# Patient Record
Sex: Male | Born: 2005
Health system: Southern US, Community
[De-identification: ages and names within clinical notes are randomized; demographics above are authoritative.]

## PROBLEM LIST (undated history)

## (undated) HISTORY — PX: TONSILLECTOMY: SUR1361

---
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
## 2024-05-07 ENCOUNTER — Telehealth: Payer: Self-pay

## 2024-05-07 ENCOUNTER — Other Ambulatory Visit (HOSPITAL_COMMUNITY): Payer: Self-pay

## 2024-05-07 NOTE — Telephone Encounter (Signed)
 Pharmacy Patient Advocate Encounter   Received notification from Fax that prior authorization for Fingolimod  is required/requested.   Insurance verification completed.   The patient is insured through [facility].   Per test claim: PA required; PA submitted to above mentioned insurance via Prompt PA Key/confirmation #/EOC [id number] Status is pending

## 2024-05-11 NOTE — Telephone Encounter (Signed)
 Pharmacy Patient Advocate Encounter  Received notification from [employer] that Prior Authorization for Fingolimod  has been APPROVED from 05/11/2024 to 05/10/2025   PA #/Case ID/Reference #: [id number]

## 2024-05-24 NOTE — Progress Notes (Unsigned)
 "   No chief complaint on file.   HISTORY OF PRESENT ILLNESS:  05/24/2024 ALL:  Richard Montes is a 18 y.o. male here today for follow up for RRMS. He continues fingolimod .   Gait  Sleep  Mood   Vitamin D   HISTORY (copied from Dr Duncan previous note)  Richard Montes is a 18 year old male with diagnosis of relapsing remitting multiple sclerosis.     Update 11/06/2023:: He is tolerating Gilenya  well.    He denies any new neurologic symptoms and no exacerbations since last visit.       MRI of the brain in 2024 did not show any new lesions compared to September 2023.  MRI of the brain September 2023 showed 1 focus not present in September 2022.  It is unclear if this happened during the 2 months before starting therapy.   The gait and balance are fine.   He has no trouble keeping up with friends. He has no falls.   No numbness or tingling.   Bladder function is fine.   Vision is fine.  Last year he had double vision and it completely resolved.   No acuity or color vision change.      He notes fatigue.  He exercises regularly.   He is on summer break and sleeps irregularly.   He also sometimes skips a meal.   . He occasionally takes melatonin   He denies depression or anxiety.      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     He is taking a Vit D supplement and laast level was 67 (was 17 at disease onset)   MS history: Unnamed  presented with dizziness , double vision and left tongue numbness in May 2022.  He does not recall if diplopia was worse to the left or right.   The diplopia was binocular.   Balance was mildly off.   He denied ataxia. He was referred to Dr. Corinthia of pediatric neurology.  An MRI of the brain was ordered.  It was performed 11/17/2020 and was abnormal showing multiple T2/FLAIR hyperintense foci predominantly in the hemispheres.  One focus was in the left middle  cerebellar peduncle and another in the thalamus.  The focus in the left middle cerebellar peduncle could explain his symptoms.  Due to the results, he  was admitted to [facility] for further evaluation and treatment.  He had MRI imaging of the spine that was normal.  CSF was obtained.  It showed 3 oligoclonal bands in the CSF not present in the serum and the IgG index was elevated.  He received 3 days of IV SOlumedrol in the hospital and is being titrated down currently.   Symptoms improved and he is baseline now.  He started Gilenya  late November 2022.   No FH of MS or autoimmune disorders.  His mom was evaluated for autoimmune alopecia but changes were felt to be due to a pregnancy at that time.       MRI images personally reviewed: MRI brain 05/07/2023 showed Multiple T2/FLAIR hyperintense foci in the cerebral hemispheres, left middle cerebellar peduncle and cerebellum in a pattern consistent with chronic demyelinating plaque associated with multiple sclerosis. None of the foci enhance or appear to be acute. Compared to the MRI from 02/20/2022, there were no new lesions.      MRI 02/20/2022 of the brain shows  T2/FLAIR hyperintense foci in the cerebral hemispheres and foci in the cerebellum consistent with chronic demyelinating plaque associated with multiple sclerosis.  None of the foci appear to be acute and they do not enhance.  However, one focus in the right cerebellar hemisphere seen on today's study was not present on the 02/14/2021 MRI.  Normal enhancement pattern.  No acute findings.   MRI of the brain 02/14/2021 showed 1 additional lesion in the cerebellum compared to the 11/17/2020 MRI.  There were no enhancing lesions.   MRI of the brain 11/17/2020 shows multiple T2/FLAIR hyperintense foci in the left middle cerebellar peduncle, right thalamus and in the periventricular, juxtacortical and deep white matter of both hemispheres.  None of the foci appear to be acute.  They do not  enhance.   MRI of the cervical, thoracic and lumbar spine 11/21/2020 was normal.  The spinal cord had normal signal without evidence of demyelination.  There was no significant degenerative changes noted.   CSF 11/20/2020 showed 3 oligoclonal bands in the CSF not present in the serum and an elevated IgG index.  Myelin basic protein was mildly elevated.   Other labs: HIV, CRP, ESR, TSH, CMP, CBC and anti-NMO antibody were normal or negative.  Vitamin D  was reduced at 17 (30-100 ng/mL normal)   REVIEW OF SYSTEMS: Out of a complete 14 system review of symptoms, the patient complains only of the following symptoms, and all other reviewed systems are negative.   ALLERGIES: Allergies[1]   HOME MEDICATIONS: Outpatient Medications Prior to Visit  Medication Sig Dispense Refill   Fingolimod  HCl 0.5 MG CAPS Take 1 capsule (0.5 mg total) by mouth daily. 30 capsule 11   Vitamin D , Ergocalciferol , (DRISDOL ) 1.25 MG (50000 UNIT) CAPS capsule Take 1 capsule (50,000 Units total) by mouth every 7 (seven) days. 13 capsule 3   No facility-administered medications prior to visit.     PAST MEDICAL HISTORY: No past medical history on file.   PAST SURGICAL HISTORY: Past Surgical History:  Procedure Laterality Date   TONSILLECTOMY       FAMILY HISTORY: Family History  Problem Relation Age of Onset   Hypertension Maternal Grandfather    Diabetes Paternal Grandfather    Multiple sclerosis Neg Hx      SOCIAL HISTORY: Social History   Socioeconomic History   Marital status: Single    Spouse name: Not on file   Number of children: Not on file   Years of education: Not on file   Highest education level: Not on file  Occupational History   Not on file  Tobacco Use   Smoking status: Never   Smokeless tobacco: Never  Vaping Use   Vaping status: Unknown  Substance and Sexual Activity   Alcohol use: Never   Drug use: Never   Sexual activity: Never  Other Topics Concern   Not on file   Social History Narrative   Lives with mom, dad, 2 brothers and sister.    In college at [employer]   Social Drivers of Health   Tobacco Use: Low Risk (11/06/2023)   Patient History    Smoking Tobacco Use: Never    Smokeless Tobacco Use: Never    Passive Exposure: Not on file  Financial Resource Strain: Not on file  Food Insecurity: Not on file  Transportation Needs: Not on file  Physical Activity: Not on file  Stress: Not on file  Social Connections: Not on file  Intimate Partner Violence: Not on file  Depression (PHQ2-9):  Not on file  Alcohol Screen: Not on file  Housing: Not on file  Utilities: Not on file  Health Literacy: Not on file     PHYSICAL EXAM  There were no vitals filed for this visit. There is no height or weight on file to calculate BMI.  Generalized: Well developed, in no acute distress  Cardiology: normal rate and rhythm, no murmur auscultated  Respiratory: clear to auscultation bilaterally    Neurological examination  Mentation: Alert oriented to time, place, history taking. Follows all commands speech and language fluent Cranial nerve II-XII: Pupils were equal round reactive to light. Extraocular movements were full, visual field were full on confrontational test. Facial sensation and strength were normal. Uvula tongue midline. Head turning and shoulder shrug  were normal and symmetric. Motor: The motor testing reveals 5 over 5 strength of all 4 extremities. Good symmetric motor tone is noted throughout.  Sensory: Sensory testing is intact to soft touch on all 4 extremities. No evidence of extinction is noted.  Coordination: Cerebellar testing reveals good finger-nose-finger and heel-to-shin bilaterally.  Gait and station: Gait is normal. Tandem gait is normal. Romberg is negative. No drift is seen.  Reflexes: Deep tendon reflexes are symmetric and normal bilaterally.    DIAGNOSTIC DATA (LABS, IMAGING, TESTING) - I reviewed patient records, labs, notes,  testing and imaging myself where available.  Lab Results  Component Value Date   WBC 2.7 (L) 11/06/2023   HGB 14.0 11/06/2023   HCT 47.0 11/06/2023   MCV 90 11/06/2023   PLT 235 11/06/2023      Component Value Date/Time   NA 141 04/22/2023 1035   K 4.0 04/22/2023 1035   CL 104 04/22/2023 1035   CO2 25 04/22/2023 1035   GLUCOSE 93 04/22/2023 1035   GLUCOSE 96 11/20/2020 1838   BUN 9 04/22/2023 1035   CREATININE 0.97 04/22/2023 1035   CALCIUM 9.4 04/22/2023 1035   PROT 7.0 11/06/2023 1035   ALBUMIN 4.8 11/06/2023 1035   AST 27 11/06/2023 1035   ALT 76 (H) 11/06/2023 1035   ALKPHOS 114 11/06/2023 1035   BILITOT 0.9 11/06/2023 1035   GFRNONAA NOT CALCULATED 11/20/2020 1838   No results found for: CHOL, HDL, LDLCALC, LDLDIRECT, TRIG, CHOLHDL No results found for: YHAJ8R No results found for: VITAMINB12 Lab Results  Component Value Date   TSH 1.748 11/20/2020        No data to display               No data to display           ASSESSMENT AND PLAN  18 y.o. year old male  has no past medical history on file. here with    No diagnosis found.  Remigio P Scriven ***.  Healthy lifestyle habits encouraged. *** will follow up with PCP as directed. *** will return to see me in ***, sooner if needed. *** verbalizes understanding and agreement with this plan.   No orders of the defined types were placed in this encounter.    No orders of the defined types were placed in this encounter.    Greig Forbes, MSN, FNP-C 05/24/2024, 4:24 PM  [facility] [address] [place], [place] [address] [phone]     [1] No Known Allergies  "

## 2024-05-24 NOTE — Patient Instructions (Signed)

## 2024-05-25 ENCOUNTER — Ambulatory Visit: Admitting: Family Medicine

## 2024-05-25 ENCOUNTER — Encounter: Payer: Self-pay | Admitting: Family Medicine

## 2024-05-25 VITALS — BP 111/61 | Ht 67.0 in | Wt 135.5 lb

## 2024-05-25 DIAGNOSIS — G35D Multiple sclerosis, unspecified: Secondary | ICD-10-CM

## 2024-05-25 DIAGNOSIS — E559 Vitamin D deficiency, unspecified: Secondary | ICD-10-CM

## 2024-05-25 DIAGNOSIS — Z79899 Other long term (current) drug therapy: Secondary | ICD-10-CM

## 2024-05-26 ENCOUNTER — Ambulatory Visit: Payer: Self-pay | Admitting: Neurology

## 2024-05-26 DIAGNOSIS — G35D Multiple sclerosis, unspecified: Secondary | ICD-10-CM

## 2024-05-26 LAB — CBC WITH DIFFERENTIAL/PLATELET
Basophils Absolute: 0 x10E3/uL (ref 0.0–0.2)
Basos: 0 %
EOS (ABSOLUTE): 0.2 x10E3/uL (ref 0.0–0.4)
Eos: 5 %
Hematocrit: 42.6 % (ref 37.5–51.0)
Hemoglobin: 13.4 g/dL (ref 13.0–17.7)
Immature Grans (Abs): 0 x10E3/uL (ref 0.0–0.1)
Immature Granulocytes: 0 %
Lymphocytes Absolute: 0.5 x10E3/uL — ABNORMAL LOW (ref 0.7–3.1)
Lymphs: 17 %
MCH: 27.9 pg (ref 26.6–33.0)
MCHC: 31.5 g/dL (ref 31.5–35.7)
MCV: 89 fL (ref 79–97)
Monocytes Absolute: 0.3 x10E3/uL (ref 0.1–0.9)
Monocytes: 10 %
Neutrophils Absolute: 2 x10E3/uL (ref 1.4–7.0)
Neutrophils: 68 %
Platelets: 218 x10E3/uL (ref 150–450)
RBC: 4.8 x10E6/uL (ref 4.14–5.80)
RDW: 13.2 % (ref 11.6–15.4)
WBC: 3 x10E3/uL — ABNORMAL LOW (ref 3.4–10.8)

## 2024-05-26 LAB — VITAMIN D 25 HYDROXY (VIT D DEFICIENCY, FRACTURES): Vit D, 25-Hydroxy: 28.3 ng/mL — ABNORMAL LOW (ref 30.0–100.0)

## 2024-06-04 ENCOUNTER — Other Ambulatory Visit: Payer: Self-pay | Admitting: Neurology

## 2024-06-04 DIAGNOSIS — G35D Multiple sclerosis, unspecified: Secondary | ICD-10-CM

## 2024-06-04 NOTE — Telephone Encounter (Signed)
 Last seen on 05/25/24 Follow up scheduled on 01/24/25

## 2024-06-17 ENCOUNTER — Other Ambulatory Visit: Payer: Self-pay

## 2024-06-17 DIAGNOSIS — G35D Multiple sclerosis, unspecified: Secondary | ICD-10-CM

## 2024-06-17 MED ORDER — FINGOLIMOD HCL 0.5 MG PO CAPS: 1.0000 | ORAL_CAPSULE | Freq: Every day | ORAL | 2 refills | Status: DC

## 2024-06-17 MED ORDER — FINGOLIMOD HCL 0.5 MG PO CAPS: 1.0000 | ORAL_CAPSULE | Freq: Every day | ORAL | 2 refills | Status: AC

## 2024-06-18 NOTE — Telephone Encounter (Signed)
 The chart shows we tried to fill it yesterday but I do not see an e-receipt from [facility]. I called [facility] and spoke with pharmacy tech Rosina and gave v.o. for Fingolimod  0.5 mg PO daily #90 refills 2.   Pt's next visit: 01/24/2025 Last visit: 05/25/2024 Per last visit: continue fingolimod  0.5 mg daily   Notified pt via mychart.

## 2024-06-18 NOTE — Telephone Encounter (Signed)
 Pt's mother called stating that [facility] has yet to receive the pt's Fingolimod  HCl 0.5 MG CAPS prescription. Pt only has one week left and is needing a refill. Mother stated that [facility] informed her that they will take a VO so Rx wont be missed. Please advise.

## 2024-09-29 ENCOUNTER — Encounter: Admitting: Family Medicine

## 2025-01-24 ENCOUNTER — Ambulatory Visit: Admitting: Neurology
